# Patient Record
Sex: Male | Born: 1995 | Race: White | Hispanic: No | Marital: Single | Smoking: Current every day smoker
Health system: Southern US, Community
[De-identification: ages and names within clinical notes are randomized; demographics above are authoritative.]

---
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
## 2022-03-01 ENCOUNTER — Encounter (HOSPITAL_COMMUNITY): Payer: Self-pay | Admitting: Emergency Medicine

## 2022-03-01 ENCOUNTER — Ambulatory Visit (HOSPITAL_COMMUNITY)
Admission: EM | Admit: 2022-03-01 | Discharge: 2022-03-01 | Disposition: A | Discharge status: Home / Self Care | Payer: Self-pay

## 2022-03-01 DIAGNOSIS — H05012 Cellulitis of left orbit: Secondary | ICD-10-CM

## 2022-03-01 DIAGNOSIS — H5712 Ocular pain, left eye: Secondary | ICD-10-CM

## 2022-03-01 NOTE — ED Provider Notes (Signed)
?[place] ? ? ? ?CSN: [id number] ?Arrival date & time: 03/01/22  1058 ? ? ?  ? ?History   ?Chief Complaint ?Chief Complaint  ?Patient presents with  ? Facial Swelling  ? ? ?HPI ?EARLAND SHALABY is a 26 y.o. male.  ? ?26 year old male who presents with left eye lid pain and swelling.  Patient indicates for the past 3 days he is having progressive left upper eyelid swelling and pain medially.  Patient indicates that he has redness in the medial aspect of the eye with pain being a 6-8 out of 10.  Patient relates she has been taking ibuprofen from Motrin which seems to relieve the discomfort and has been using warm compresses frequently.  He relates that he has not had any drainage from the area.  He does indicate that he gets these cystic formations frequently in the forehead and around the eyes.  Patient relates he is not having any vision changes.  Patient is concerned with the degree of swelling, redness, and pain that this particular episode is causing.  He desires to have the area lanced. ? ? ? ?History reviewed. No pertinent past medical history. ? ?Patient Active Problem List  ? Diagnosis Date Noted  ? Rash 07/06/2012  ? ? ?History reviewed. No pertinent surgical history. ? ? ? ? ?Home Medications   ? ?Prior to Admission medications   ?Medication Sig Start Date End Date Taking? Authorizing Provider  ?permethrin (ELIMITE) 5 % cream Apply from the neck down to soles of feet, leave on for 12 hours, then rinse off.  May repeat in 1 wk if needed. 07/06/12   McGowen, Adrian Blackwater, MD  ? ? ?Family History ?Family History  ?Problem Relation Age of Onset  ? Cancer Paternal Aunt   ?     breast  ? ? ?Social History ?Social History  ? ?Tobacco Use  ? Smoking status: Every Day  ? Smokeless tobacco: Never  ?Substance Use Topics  ? Alcohol use: No  ? Drug use: No  ? ? ? ?Allergies   ?Patient has no known allergies. ? ? ?Review of Systems ?Review of Systems  ?Eyes:  Positive for pain (eyelid pain upper eyelids with  swelling and redness).  ? ? ?Physical Exam ?Triage Vital Signs ?ED Triage Vitals  ?Enc Vitals Group  ?   BP 03/01/22 1138 (!) 127/100  ?   Pulse Rate 03/01/22 1138 (!) 104  ?   Resp 03/01/22 1138 15  ?   Temp 03/01/22 1138 98.2 ?F (36.8 ?C)  ?   Temp Source 03/01/22 1138 Oral  ?   SpO2 03/01/22 1138 98 %  ?   Weight --   ?   Height --   ?   Head Circumference --   ?   Peak Flow --   ?   Pain Score 03/01/22 1136 6  ?   Pain Loc --   ?   Pain Edu? --   ?   Excl. in [place]? --   ? ?No data found. ? ?Updated Vital Signs ?BP (!) 127/100 (BP Location: Left Arm)   Pulse (!) 104   Temp 98.2 ?F (36.8 ?C) (Oral)   Resp 15   SpO2 98%  ? ?Visual Acuity ?Right Eye Distance:   ?Left Eye Distance:   ?Bilateral Distance:   ? ?Right Eye Near:   ?Left Eye Near:    ?Bilateral Near:    ? ?Physical Exam ?Constitutional:   ?   Appearance:  Normal appearance.  ?HENT:  ?   Mouth/Throat:  ?   Comments: Mouth: Pharynx is clear without any redness or swelling. ?Neck: No lymphadenopathy noted bilaterally. ?Eyes:  ?   Comments: Labs: The left upper eyelid is swollen 2 plus.  There is significant swelling and redness of the inner aspect medially adjacent to the upper tear duct.  The redness extends into the upper part of the left forehead 2 cm above the eyebrows, medially to the bridge of the nose, and laterally to the outer part of the eyelid.  There is no drainage present.  EOMI is normal PERRLA.  ?Neurological:  ?   Mental Status: He is alert.  ? ? ? ?UC Treatments / Results  ?Labs ?(all labs ordered are listed, but only abnormal results are displayed) ?Labs Reviewed - No data to display ? ?EKG ? ? ?Radiology ?No results found. ? ?Procedures ?Procedures (including critical care time) ? ?Medications Ordered in UC ?Medications - No data to display ? ?Initial Impression / Assessment and Plan / UC Course  ?I have reviewed the triage vital signs and the nursing notes. ? ?Pertinent labs & imaging results that were available during my care of the  patient were reviewed by me and considered in my medical decision making (see chart for details). ? ?Plan: ?Patient is referred to [facility] ophthalmology immediately today for evaluation and treatment by Dr. Lucianne Lei. ?Patient advised to return if symptoms fail to improve. ?Final Clinical Impressions(s) / UC Diagnoses  ? ?Final diagnoses:  ?None  ? ?Discharge Instructions   ?None ?  ? ?ED Prescriptions   ?None ?  ? ?PDMP not reviewed this encounter. ?  ?Nyoka Lint, PA-C ?03/01/22 1220 ? ?

## 2022-03-01 NOTE — Discharge Instructions (Signed)
Advised follow-up with [facility] ophthalmology today, 03/01/2022. ?

## 2022-03-01 NOTE — ED Triage Notes (Signed)
Pt having left eye swelling and pain for 3 days. Reports pain today with movement of eye. Denies visual disturbance out of eye or discharge or drainage. Advil helps little with pain  ?
